# Patient Record
Sex: Female | Born: 2000 | Race: White | Hispanic: No | Marital: Single | Smoking: Never smoker
Health system: Southern US, Community
[De-identification: ages and names within clinical notes are randomized; demographics above are authoritative.]

---
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
## 2020-06-21 ENCOUNTER — Ambulatory Visit: Payer: No Typology Code available for payment source | Admitting: Adult Health

## 2020-07-19 ENCOUNTER — Ambulatory Visit
Admission: RE | Admit: 2020-07-19 | Discharge: 2020-07-19 | Disposition: A | Discharge status: Home / Self Care | Payer: Medicaid Other | Source: Ambulatory Visit | Attending: Emergency Medicine | Admitting: Emergency Medicine

## 2020-07-19 ENCOUNTER — Other Ambulatory Visit: Payer: Self-pay

## 2020-07-19 VITALS — BP 104/75 | HR 87 | Temp 98.7°F | Resp 18 | Ht 65.0 in | Wt 140.0 lb

## 2020-07-19 DIAGNOSIS — Z20822 Contact with and (suspected) exposure to covid-19: Secondary | ICD-10-CM

## 2020-07-19 NOTE — ED Triage Notes (Signed)
Nurse visit only.  Patient just wants to be tested.

## 2020-07-19 NOTE — ED Triage Notes (Signed)
Sore throat started Thursday, fever over the weekend and cough boy friend is covid positive

## 2020-07-21 LAB — NOVEL CORONAVIRUS, NAA: SARS-CoV-2, NAA: DETECTED — AB

## 2020-07-21 LAB — SARS-COV-2, NAA 2 DAY TAT

## 2020-10-15 ENCOUNTER — Other Ambulatory Visit: Payer: Self-pay

## 2020-10-15 ENCOUNTER — Ambulatory Visit (INDEPENDENT_AMBULATORY_CARE_PROVIDER_SITE_OTHER): Payer: Self-pay | Admitting: Adult Health

## 2020-10-15 ENCOUNTER — Encounter: Payer: Self-pay | Admitting: Adult Health

## 2020-10-15 VITALS — BP 114/69 | HR 63 | Ht 65.0 in | Wt 141.0 lb

## 2020-10-15 DIAGNOSIS — Z3046 Encounter for surveillance of implantable subdermal contraceptive: Secondary | ICD-10-CM | POA: Insufficient documentation

## 2020-10-15 NOTE — Progress Notes (Signed)
  Subjective:     Patient ID: Christy Thompson, female   DOB: 06-22-2001, 20 y.o.   MRN: [id number]  HPI Christy Thompson is a 20 year old white female, single, G0P0, in requesting nexplanon removal.  PCP is [facility].  Review of Systems Had irregular periods with nexplanon Last sex about 4 months ago. Reviewed past medical,surgical, social and family history. Reviewed medications and allergies.      Objective:   Physical Exam BP 114/69 (BP Location: Left Arm, Patient Position: Sitting, Cuff Size: Normal)   Pulse 63   Ht 5\' 5"  (1.651 m)   Wt 141 lb (64 kg)   BMI 23.46 kg/m Consent signed and time out called. Left arm cleansed with betadine, and injected with 1.5 cc 2% lidocaine and waited til numb.Under sterile technique a #11 blade was used to make small vertical incision, and a curved forceps was used to easily remove rod. Steri strips applied. Pressure dressing applied.     Upstream - 10/15/20 1141      Pregnancy Intention Screening   Does the patient want to become pregnant in the next year? No    Does the patient's partner want to become pregnant in the next year? No    Would the patient like to discuss contraceptive options today? Yes      Contraception Wrap Up   Current Method Hormonal Implant    End Method Female Condom    Contraception Counseling Provided Yes          Assessment:     1. Encounter for Nexplanon removal Use condoms,  keep clean and dry x 24 hours, no heavy lifting, keep steri strips on x 72 hours, Keep pressure dressing on x 24 hours. Follow up prn problems.    Plan:     Discussed phexxi and gave handout and also talked about nuva ring, call if wants to try either  Follow up prn

## 2020-10-15 NOTE — Patient Instructions (Signed)
Use condoms, keep clean and dry x 24 hours, no heavy lifting, keep steri strips on x 72 hours, Keep pressure dressing on x 24 hours. Follow up prn problems.  

## 2020-12-20 ENCOUNTER — Ambulatory Visit (INDEPENDENT_AMBULATORY_CARE_PROVIDER_SITE_OTHER): Payer: Medicaid Other | Admitting: Adult Health

## 2020-12-20 ENCOUNTER — Encounter: Payer: Self-pay | Admitting: Adult Health

## 2020-12-20 ENCOUNTER — Other Ambulatory Visit: Payer: Self-pay

## 2020-12-20 VITALS — BP 111/73 | HR 113 | Ht 65.0 in | Wt 139.0 lb

## 2020-12-20 DIAGNOSIS — Z30011 Encounter for initial prescription of contraceptive pills: Secondary | ICD-10-CM

## 2020-12-20 DIAGNOSIS — Z113 Encounter for screening for infections with a predominantly sexual mode of transmission: Secondary | ICD-10-CM

## 2020-12-20 LAB — POCT URINE PREGNANCY: Preg Test, Ur: NEGATIVE

## 2020-12-20 MED ORDER — LO LOESTRIN FE 1 MG-10 MCG / 10 MCG PO TABS: 1.0000 | ORAL_TABLET | Freq: Every day | ORAL | 4 refills | Status: AC

## 2020-12-20 NOTE — Progress Notes (Signed)
  Subjective:     Patient ID: Christy Thompson, female   DOB: 18-Jan-2001, 20 y.o.   MRN: [id number]  HPI Claudell is a 20 year old white female,single, G0P0, in to talk about getting on OCs, had nexplanon removed in April 2022. PCP is [facility].  Review of Systems Patient denies any headaches, hearing loss, fatigue, blurred vision, shortness of breath, chest pain, abdominal pain, problems with bowel movements, urination, or intercourse(not active, but thinking about it). No joint pain or mood swings. Reviewed past medical,surgical, social and family history. Reviewed medications and allergies.     Objective:   Physical Exam BP 111/73 (BP Location: Left Arm, Patient Position: Sitting, Cuff Size: Normal)   Pulse (!) 113   Ht 5\' 5"  (1.651 m)   Wt 139 lb (63 kg)   LMP 12/11/2020   BMI 23.13 kg/m  UPT is negative Skin warm and dry. Neck: mid line trachea, normal thyroid, good ROM, no lymphadenopathy noted. Lungs: clear to ausculation bilaterally. Cardiovascular: regular rate and rhythm.   Upstream - 12/20/20 0942       Pregnancy Intention Screening   Does the patient want to become pregnant in the next year? No    Does the patient's partner want to become pregnant in the next year? No    Would the patient like to discuss contraceptive options today? Yes      Contraception Wrap Up   Current Method Female Condom    End Method Oral Contraceptive;Female Condom    Contraception Counseling Provided Yes                  Assessment:     1. Encounter for initial prescription of contraceptive pills Will start lo Loestrin today, 1 pack given,use condoms Meds ordered this encounter  Medications   Norethindrone-Ethinyl Estradiol-Fe Biphas (LO LOESTRIN FE) 1 MG-10 MCG / 10 MCG tablet    Sig: Take 1 tablet by mouth daily. Take 1 daily by mouth    Dispense:  84 tablet    Refill:  4    BIN 12/22/20, PCN CN, GRP [id number]    Order Specific Question:   Supervising  Provider    Answer:   [id number] H [[id number]]      2. STD screening Urine sent for GC/CHL  Plan:   Follow up in 1 year or sooner if needed

## 2020-12-20 NOTE — Addendum Note (Signed)
Addended by: Moss Mc on: 12/20/2020 12:18 PM   Modules accepted: Orders

## 2020-12-22 LAB — GC/CHLAMYDIA PROBE AMP
Chlamydia trachomatis, NAA: NEGATIVE
Neisseria Gonorrhoeae by PCR: NEGATIVE
